# Patient Record
Sex: Female | Born: 1975 | Race: White | Hispanic: No | Smoking: Current every day smoker
Health system: Southern US, Community
[De-identification: ages and names within clinical notes are randomized; demographics above are authoritative.]

## PROBLEM LIST (undated history)

## (undated) DIAGNOSIS — F32A Depression, unspecified: Secondary | ICD-10-CM

## (undated) DIAGNOSIS — F419 Anxiety disorder, unspecified: Secondary | ICD-10-CM

## (undated) HISTORY — DX: Depression, unspecified: F32.A

## (undated) HISTORY — DX: Anxiety disorder, unspecified: F41.9

---

## 2011-04-25 ENCOUNTER — Ambulatory Visit: Payer: Self-pay

## 2012-10-04 LAB — COMPREHENSIVE METABOLIC PANEL
Anion Gap: 7 (ref 7–16)
BUN: 8 mg/dL (ref 7–18)
Bilirubin,Total: 0.9 mg/dL (ref 0.2–1.0)
Chloride: 106 mmol/L (ref 98–107)
Creatinine: 0.7 mg/dL (ref 0.60–1.30)
EGFR (Non-African Amer.): >60
Osmolality: 271 (ref 275–301)
SGOT(AST): 15 U/L (ref 15–37)
Total Protein: 7.7 g/dL (ref 6.4–8.2)

## 2012-10-04 LAB — CBC
HCT: 41.2 % (ref 35.0–47.0)
HCT: 37.3 % (ref 35.0–47.0)
HGB: 12.5 g/dL (ref 12.0–16.0)
MCH: 30 pg (ref 26.0–34.0)
MCH: 29.8 pg (ref 26.0–34.0)
MCHC: 33.5 g/dL (ref 32.0–36.0)
MCHC: 33.4 g/dL (ref 32.0–36.0)
MCV: 90 fL (ref 80–100)
Platelet: 244 10*3/uL (ref 150–440)
RBC: 4.6 10*6/uL (ref 3.80–5.20)
RDW: 13.5 % (ref 11.5–14.5)

## 2012-10-04 LAB — HCG, QUANTITATIVE, PREGNANCY: Beta Hcg, Quant.: 81517 m[IU]/mL — ABNORMAL HIGH

## 2012-10-04 LAB — URINALYSIS, COMPLETE
Specific Gravity: 1.021 (ref 1.003–1.030)
Squamous Epithelial: 4
WBC UR: 1 /HPF (ref 0–5)

## 2012-10-04 LAB — LIPASE, BLOOD: Lipase: 110 U/L (ref 73–393)

## 2012-10-05 LAB — CBC WITH DIFFERENTIAL/PLATELET
Basophil #: 0.2 10*3/uL — ABNORMAL HIGH (ref 0.0–0.1)
Basophil %: 2.3 %
Eosinophil #: 0.2 10*3/uL (ref 0.0–0.7)
Eosinophil %: 1.8 %
HGB: 12.3 g/dL (ref 12.0–16.0)
Lymphocyte %: 13 %
MCH: 29.8 pg (ref 26.0–34.0)
Monocyte #: 0.4 x10 3/mm (ref 0.2–0.9)
Monocyte %: 4 %
Neutrophil #: 7 10*3/uL — ABNORMAL HIGH (ref 1.4–6.5)
Neutrophil %: 78.9 %
Platelet: 208 10*3/uL (ref 150–440)

## 2012-10-05 LAB — CBC
HCT: 36 % (ref 35.0–47.0)
HGB: 12 g/dL (ref 12.0–16.0)
MCH: 29.6 pg (ref 26.0–34.0)
MCHC: 33.2 g/dL (ref 32.0–36.0)
MCV: 89 fL (ref 80–100)
Platelet: 184 10*3/uL (ref 150–440)

## 2012-10-05 LAB — HCG, QUANTITATIVE, PREGNANCY: Beta Hcg, Quant.: 66419 m[IU]/mL — ABNORMAL HIGH

## 2012-10-08 ENCOUNTER — Inpatient Hospital Stay: Payer: Self-pay | Admitting: Obstetrics and Gynecology

## 2012-10-08 LAB — CBC WITH DIFFERENTIAL/PLATELET
Basophil #: 0.1 10*3/uL (ref 0.0–0.1)
Eosinophil %: 1.1 %
HCT: 35.7 % (ref 35.0–47.0)
HGB: 12.3 g/dL (ref 12.0–16.0)
Lymphocyte #: 1.5 10*3/uL (ref 1.0–3.6)
Lymphocyte %: 13.3 %
MCH: 30.8 pg (ref 26.0–34.0)
MCHC: 34.5 g/dL (ref 32.0–36.0)
Monocyte #: 0.6 x10 3/mm (ref 0.2–0.9)
Monocyte %: 4.8 %
RBC: 4 10*6/uL (ref 3.80–5.20)
WBC: 11.5 10*3/uL — ABNORMAL HIGH (ref 3.6–11.0)

## 2012-10-09 LAB — HEMOGLOBIN: HGB: 12.3 g/dL (ref 12.0–16.0)

## 2014-01-31 IMAGING — US US OB < 14 WEEKS - US OB TV
1 series · 14 of 28 positions shown · non-contrast
Comparison: none

REASON FOR EXAM: pelvic pain and vaginal bleeding
COMMENTS:

[Series 1: us ob < 14 weeks - us ob tv · 0.21mm/px · 14 of 62 slices shown]
[im 3/62]
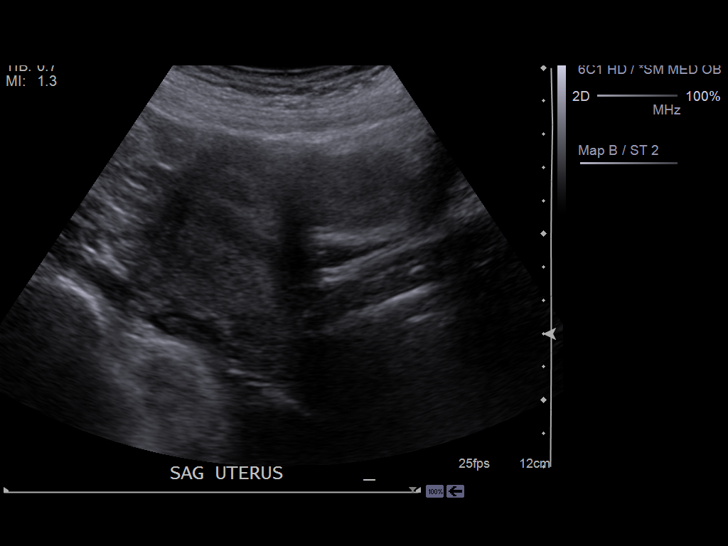
[im 7/62]
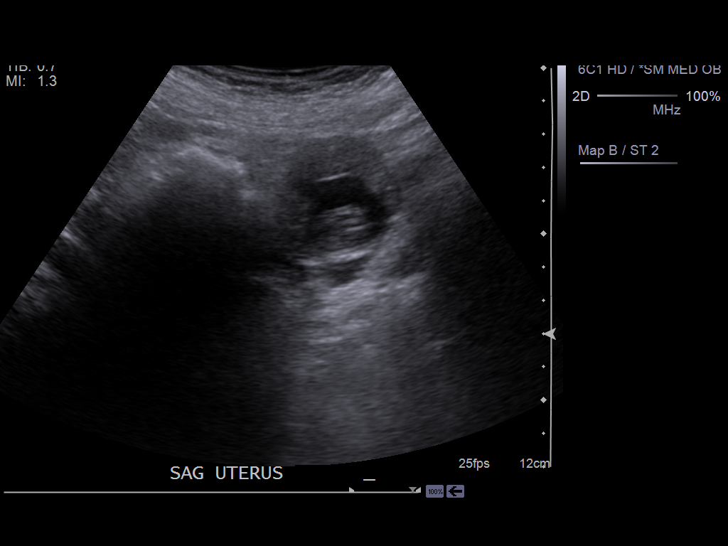
[im 12/62]
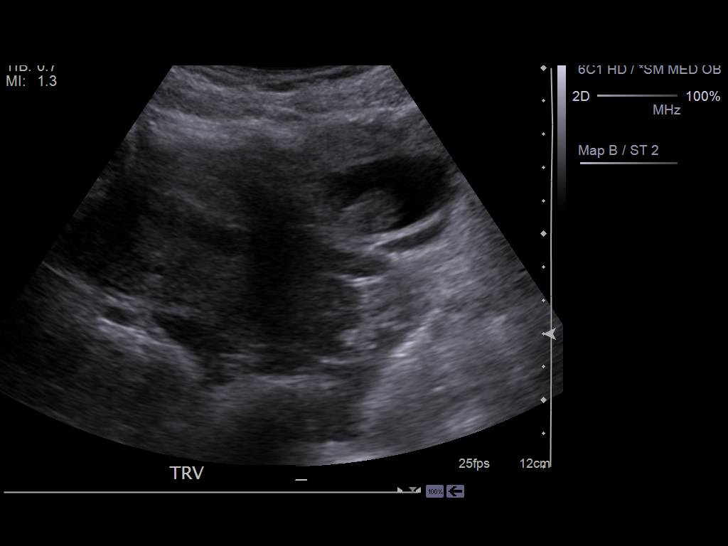
[im 16/62]
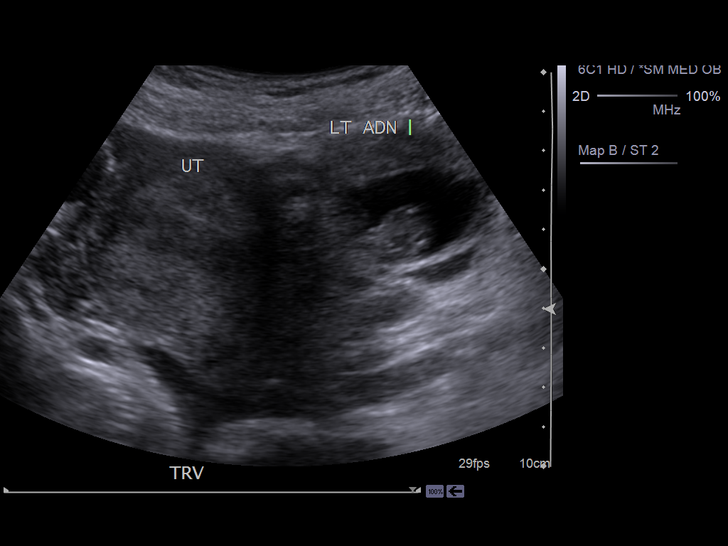
[im 21/62]
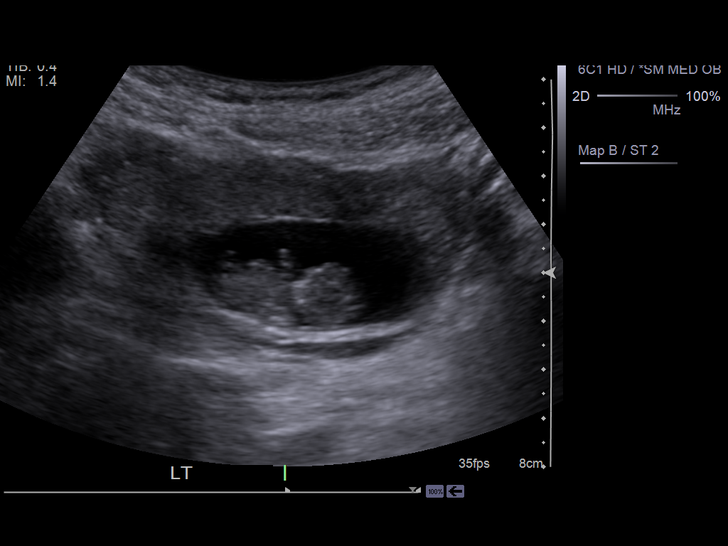
[im 25/62]
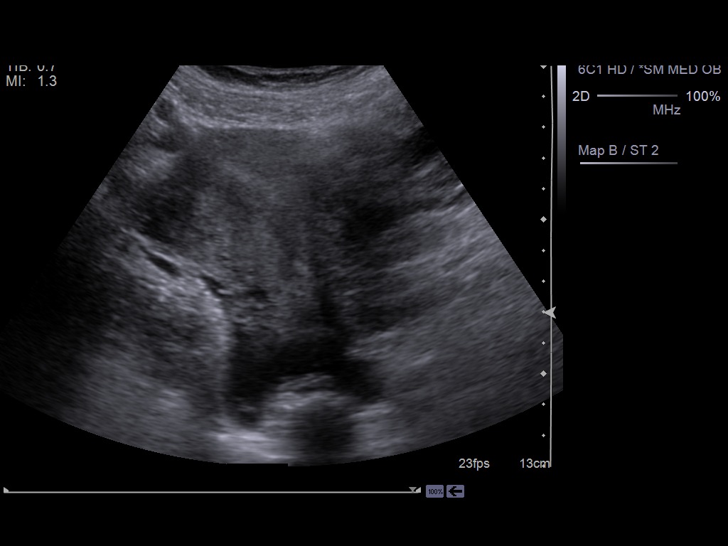
[im 30/62]
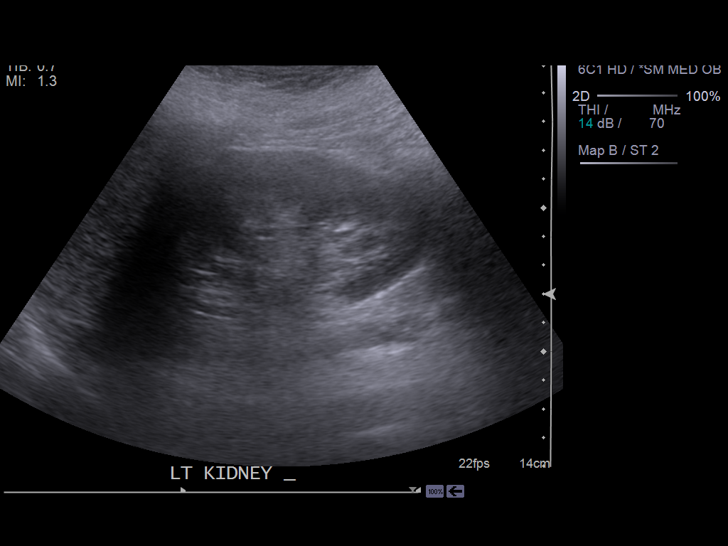
[im 34/62]
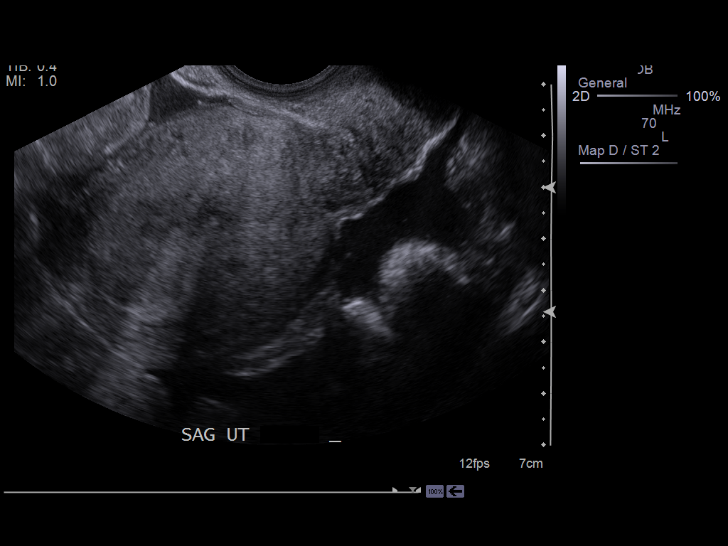
[im 39/62]
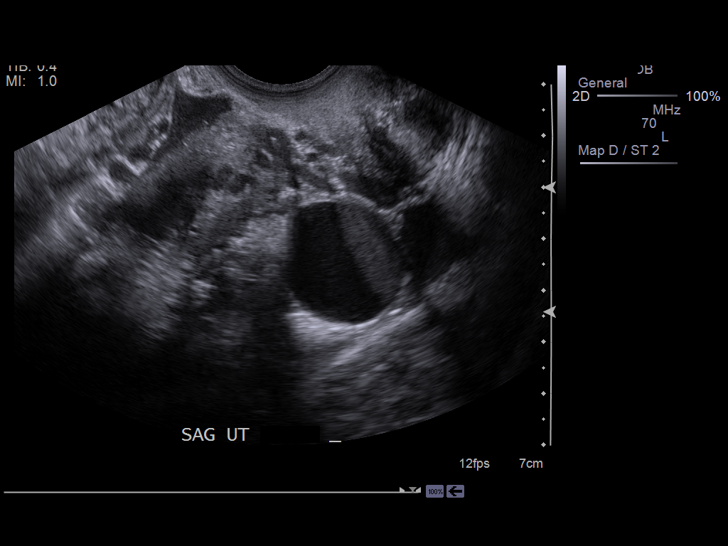
[im 43/62]
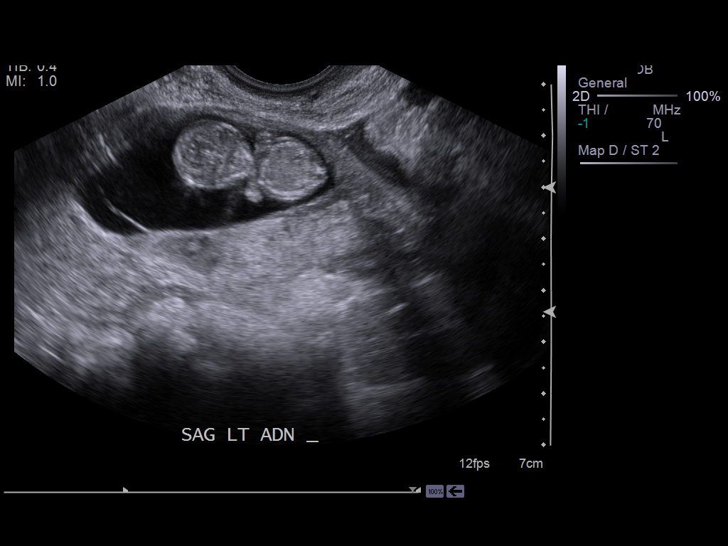
[im 48/62]
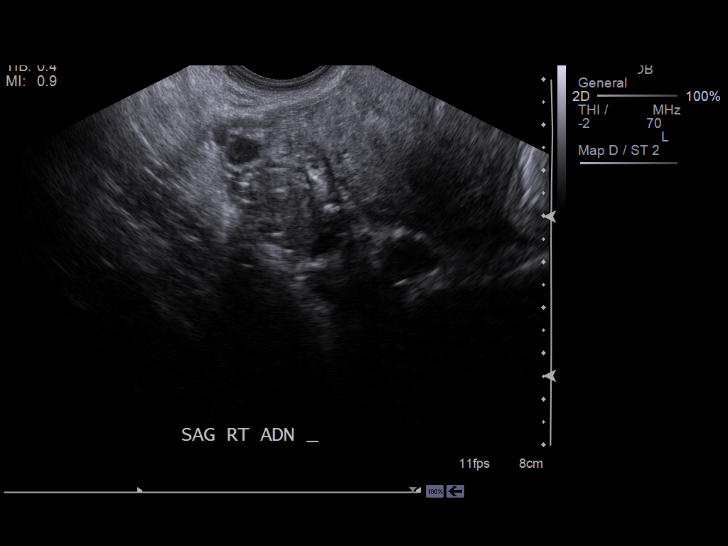
[im 52/62]
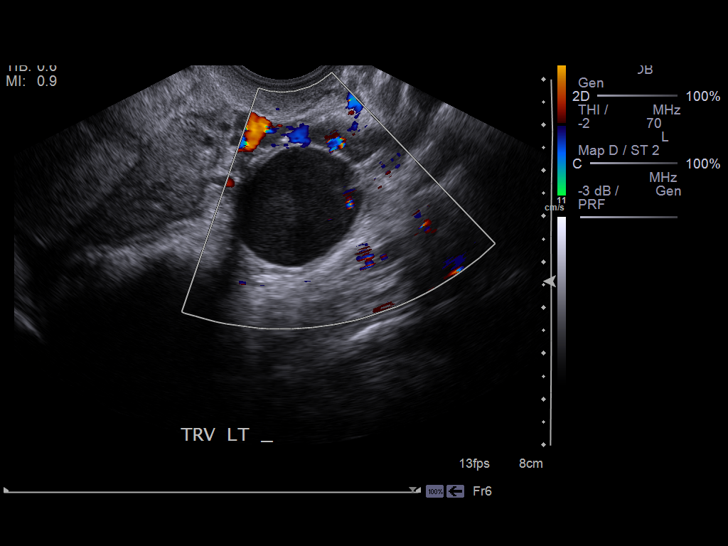
[im 57/62]
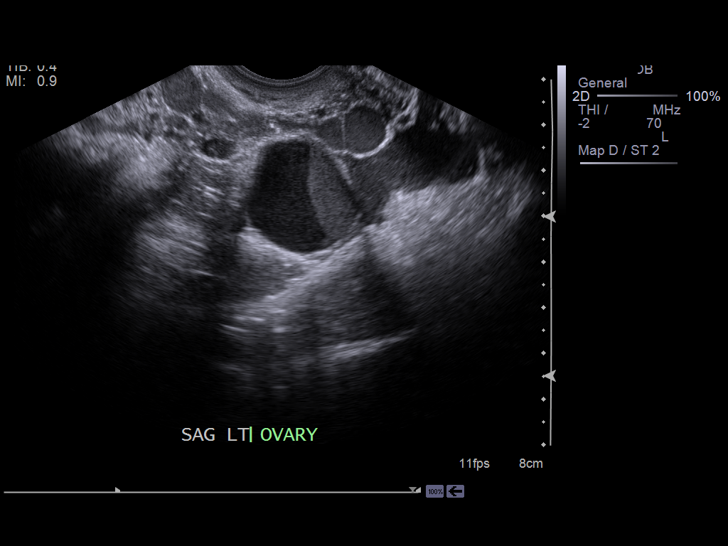
[im 62/62]
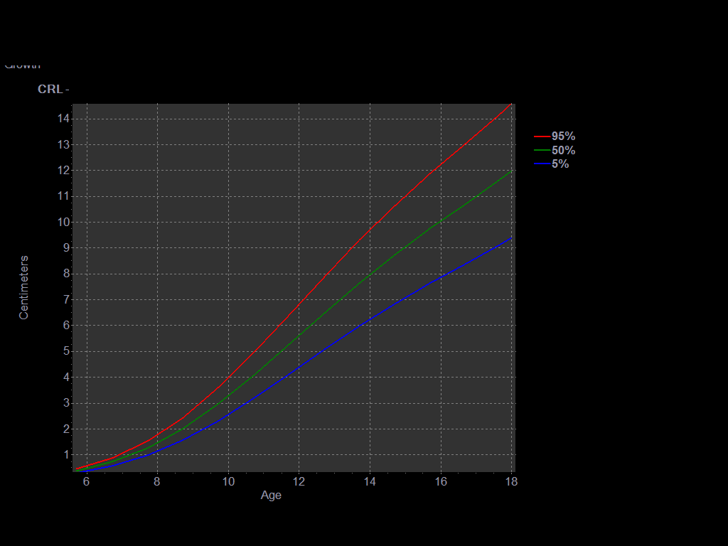

[14 of 28 positions shown; findings below may reference images not displayed]

PROCEDURE:     US  - US OB LESS THAN 14 WEEKS/W TRANS  - October 04, 2012  [DATE]

RESULT:     Standard pelvic ultrasound obtained. No IUP identified. A  9
weeks 6 day pregnancy is noted in the left adnexa consistent with ectopic
pregnancy. Complex hypoechoic region is noted inferior to the ectopic. Free
fluid is noted in the cul-de-sac. Ruptured ectopic should be considered.
IMPRESSION: Findings consistent with large ectopic pregnancy on the
left with possible rupture. This report was phoned to the patient's
physician at time of study.

## 2014-02-04 IMAGING — US US OB < 14 WKS - NRPT
1 series · 14 of 28 positions shown · non-contrast
Comparison: none

[Series 1: us ob < 14 wks - nrpt · 0.25mm/px · 14 of 147 slices shown]
[im 6/147]
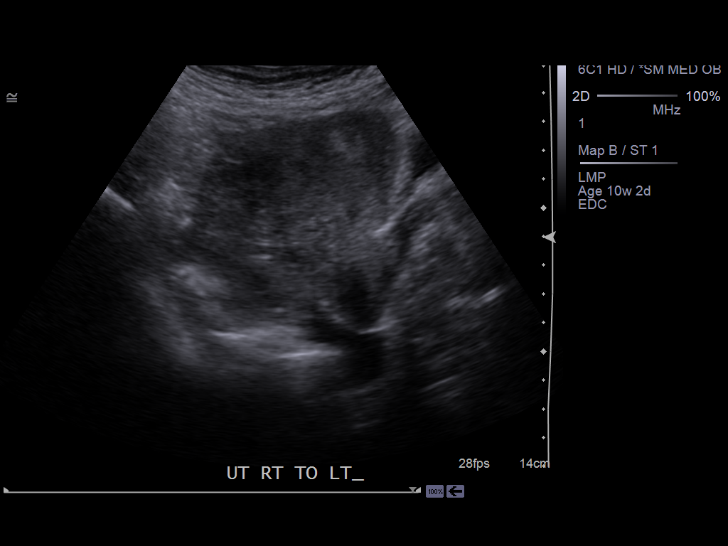
[im 17/147]
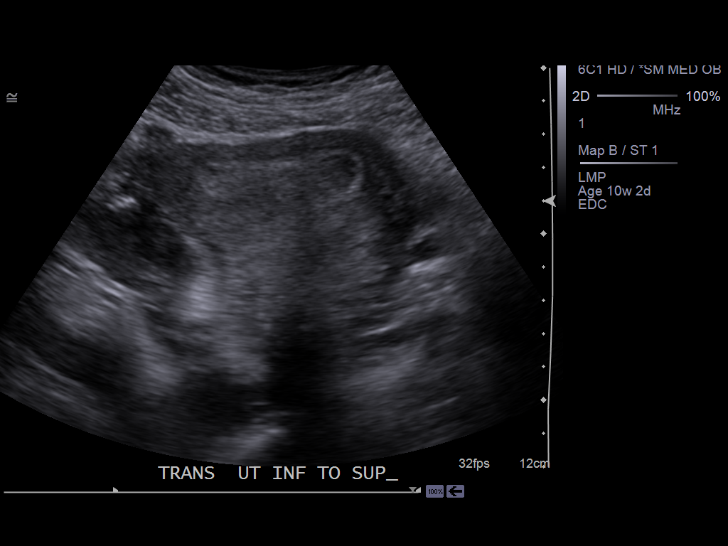
[im 28/147]
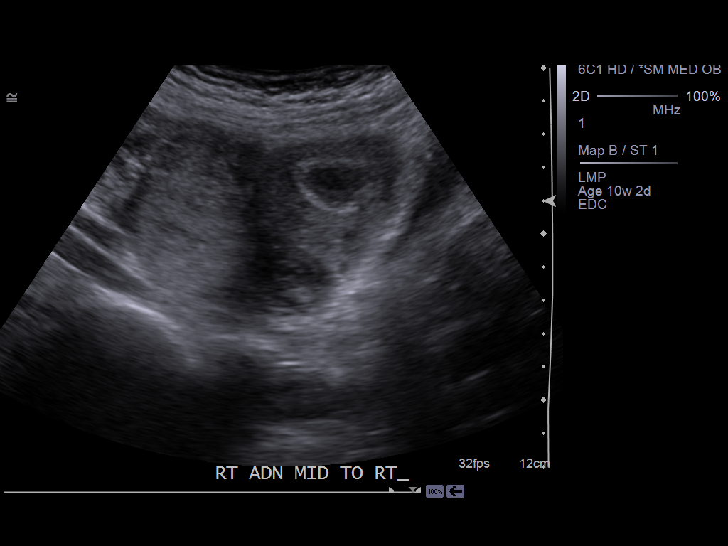
[im 38/147]
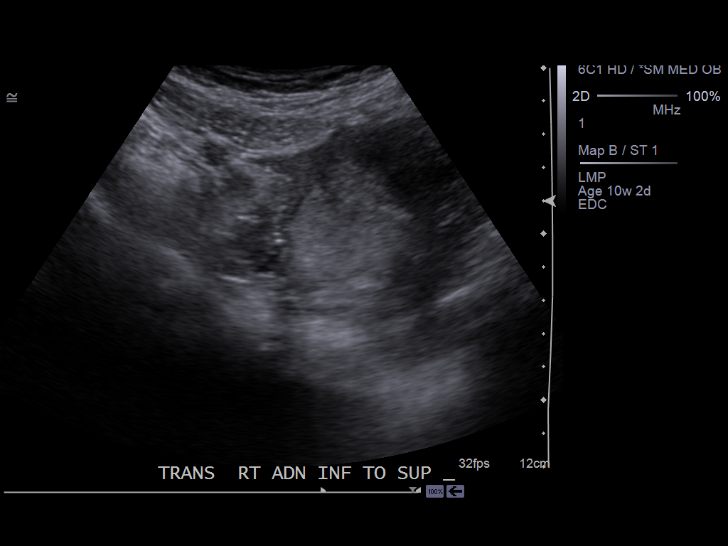
[im 49/147]
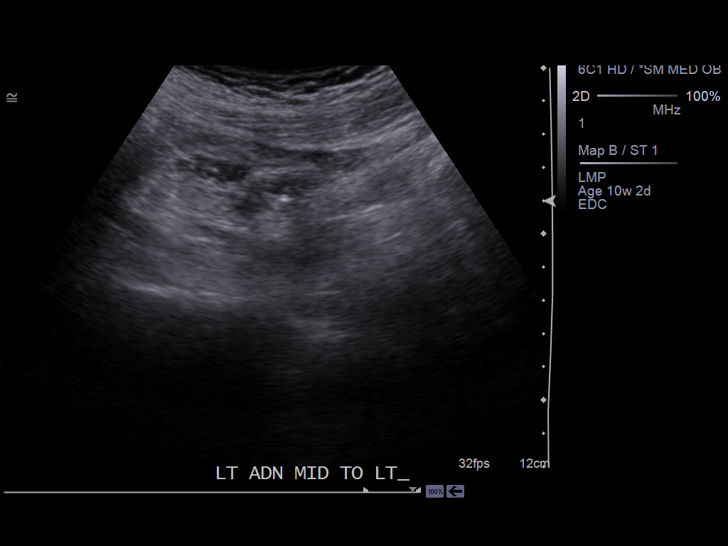
[im 60/147]
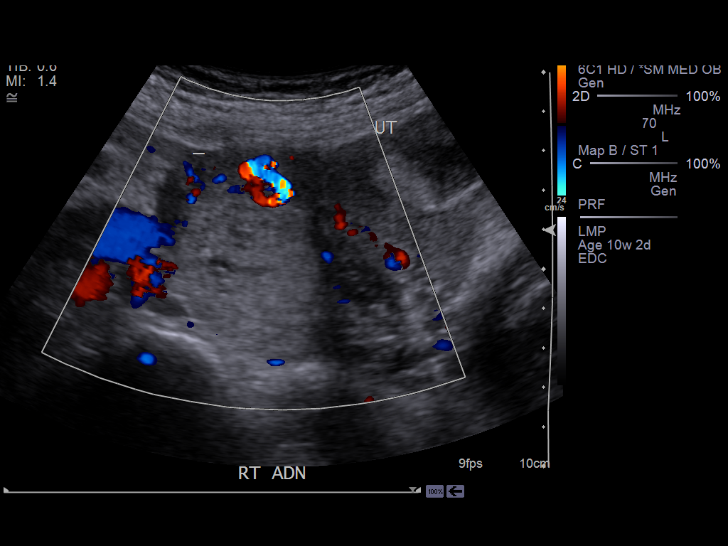
[im 71/147]
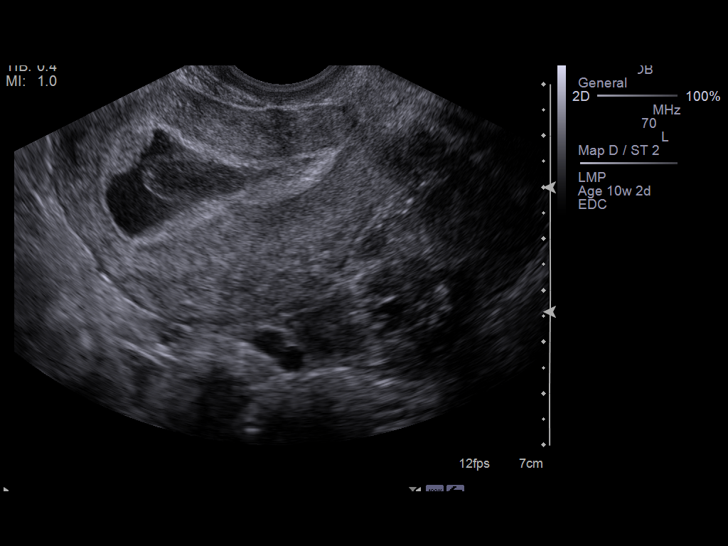
[im 82/147]
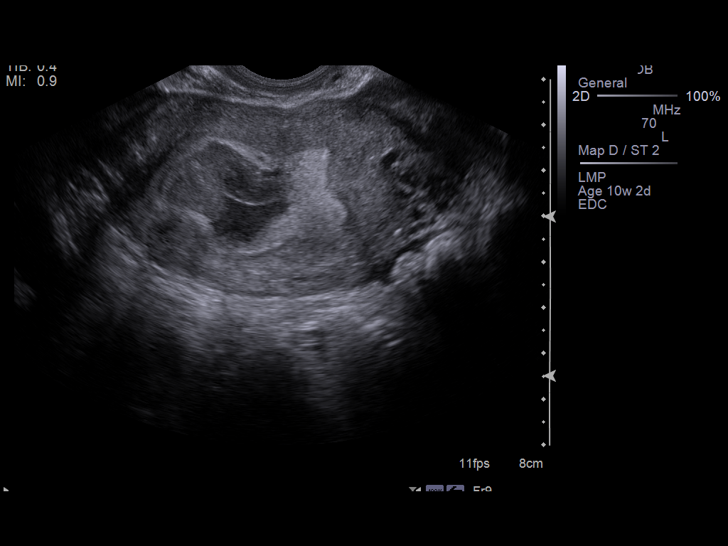
[im 92/147]
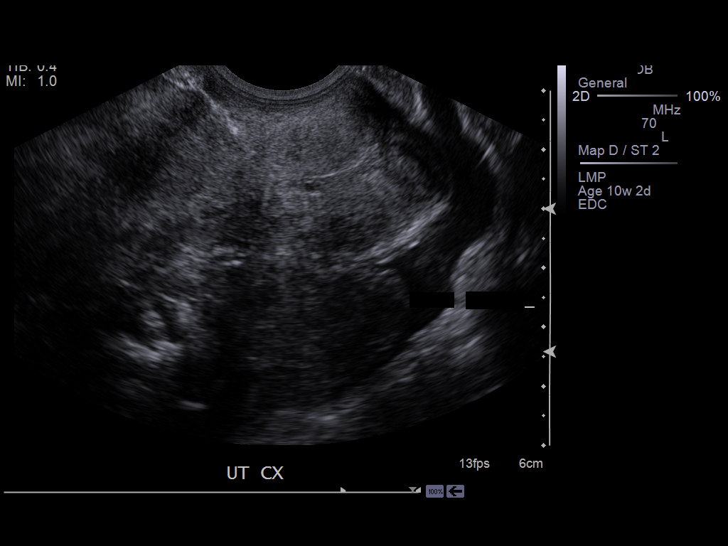
[im 103/147]
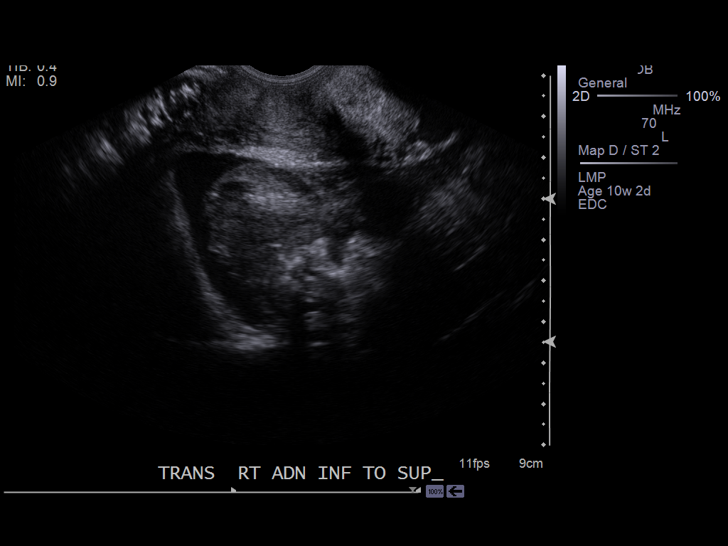
[im 114/147]
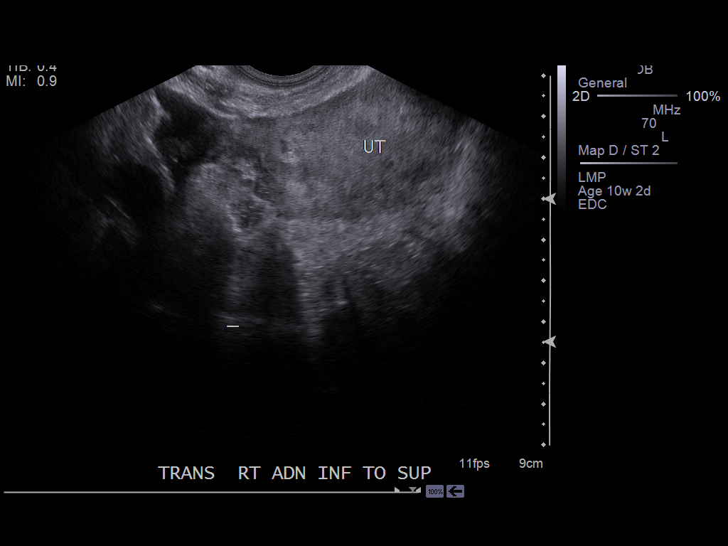
[im 125/147]
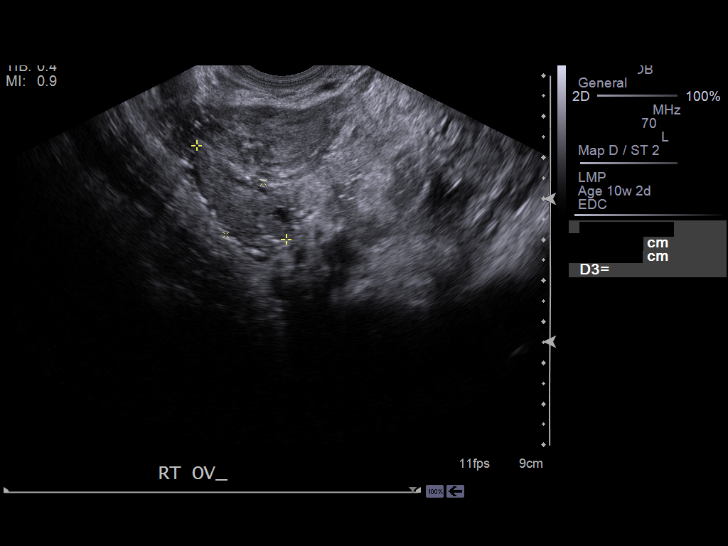
[im 136/147]
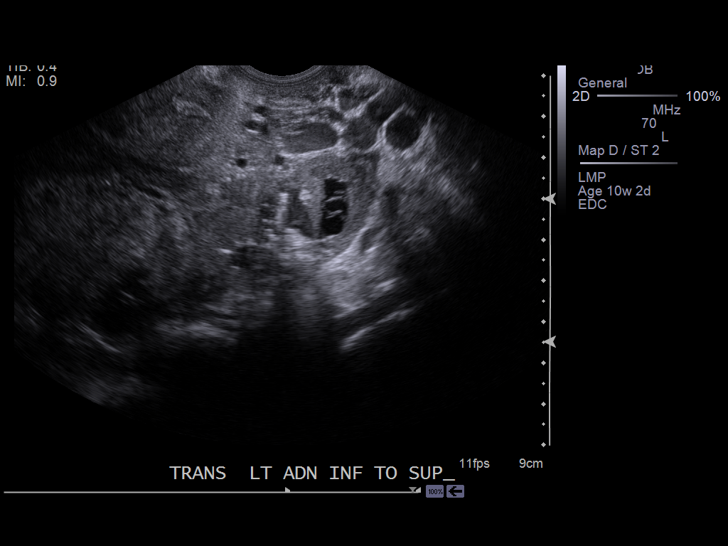
[im 147/147]
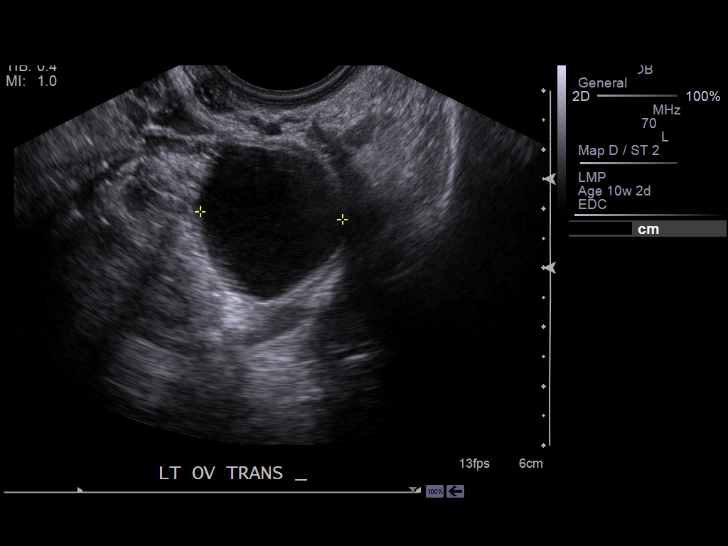

[14 of 28 positions shown; findings below may reference images not displayed]

IMAGES IMPORTED FROM THE [employer] WORKFLOW SYSTEM
NO DICTATION FOR STUDY

## 2014-11-21 NOTE — Op Note (Signed)
PATIENT NAME:  Cathy Ochoa, Cathy Ochoa MR#:  [id number] DATE OF BIRTH:  April 18, 1976  DATE OF PROCEDURE:  10/08/2012  INDICATIONS FOR PROCEDURE: The patient is a 39 year old female who was admitted with pelvic pain. Ultrasound showed a left adnexal mass approximately 6 cm diameter. The patient was taken to the operating room on 10/08/2012.   PROCEDURE: Laparotomy and removal of left tubal pregnancy with salpingectomy.   SURGEON: Cassell ClementLynde Knowles Jonas, MD   ASSISTANT: None.   ANESTHESIA: General.   ESTIMATED BLOOD LOSS: 100 mL.   COMPLICATIONS: None.   SPECIMEN REMOVED: Left tube with products of conception sent to pathology for permanent fixation.   FINDINGS: The left tube was scarred down to the posterior surface of the uterus down to the cul-de-sac and contained an approximately 3 x 6 cm mass consistent with an ectopic pregnancy.   DESCRIPTION OF PROCEDURE: The patient was taken to the OR where she was prepped and draped in the usual sterile fashion in the dorsal supine position. After placement of a Foley catheter, the patient was prepped and draped in the usual fashion for a laparotomy. The scalpel was used to make a Pfannenstiel incision, and this was carried down through the subcutaneous tissues to the fascia. The fascia was then incised bilaterally, and the fascia was then tented up and dissected sharply away from the underlying rectus muscle. The rectus muscle was split in the midline, and the peritoneum was identified, tented up, and entered without complication. The Balfour retractor was placed and the bowel was packed away. The abdomen and pelvis were explored with normal findings other than described above. There was a left tubal mass consistent with an ectopic pregnancy. It was unruptured. The round ligament on the left in the left adnexa was identified and was clamped using a Heaney hysterectomy clamp, and the infundibulopelvic ligament was identified.  A window in the broad ligament was made  using sharp dissection, and the utero-ovarian pedicle was doubly clamped, and the fallopian tube at the level of the ectopic pregnancy was cross-clamped. The ectopic was then sharply dissected off the pedicles and removed from the field. It was sent to pathology for permanent fixation. The utero-ovarian ligament was then doubly suture ligatured using 0 Vicryl, and the portion of the tube that was transected was suture ligatured using the same suture. The pedicles were noted to be hemostatic. The left sidewall was examined, and the ureter was noted to be intact and peristalsing, and the urine output was clear and approximately 125 mL.  The abdomen was then copiously irrigated. The pedicles were rechecked, found to be hemostatic. A small amount of Surgicel was placed over the pedicle site. All instruments and packing were removed from the abdomen, and the final count was noted to be correct. The fascia was then closed using a 0 Maxon, and the skin was closed using staples. The patient was extubated in the Operating Room and returned to the PACU in stable condition.    ____________________________ Beryle QuantLynde G. Toya SmothersKnowles-Jonas, MD [id number] D: 10/08/2012 18:27:01 ET T: 10/09/2012 11:17:42 ET JOB#: [id number]  cc: Beryle QuantLynde G. Toya SmothersKnowles-Jonas, MD, <Dictator> Waverly FerrariLYNDE Toya SmothersKNOWLES JONAS MD ELECTRONICALLY SIGNED 10/09/2012 19:59

## 2021-11-10 ENCOUNTER — Ambulatory Visit: Payer: Self-pay | Admitting: Family Medicine

## 2021-11-12 ENCOUNTER — Ambulatory Visit: Payer: Self-pay | Admitting: Family Medicine

## 2021-12-28 ENCOUNTER — Encounter: Payer: Self-pay | Admitting: Family Medicine

## 2021-12-28 ENCOUNTER — Ambulatory Visit (INDEPENDENT_AMBULATORY_CARE_PROVIDER_SITE_OTHER): Payer: Medicaid Other | Admitting: Family Medicine

## 2021-12-28 VITALS — BP 138/88 | HR 78 | Ht 69.0 in | Wt 218.2 lb

## 2021-12-28 DIAGNOSIS — Z124 Encounter for screening for malignant neoplasm of cervix: Secondary | ICD-10-CM | POA: Diagnosis not present

## 2021-12-28 DIAGNOSIS — F339 Major depressive disorder, recurrent, unspecified: Secondary | ICD-10-CM

## 2021-12-28 DIAGNOSIS — F32A Depression, unspecified: Secondary | ICD-10-CM | POA: Insufficient documentation

## 2021-12-28 MED ORDER — VENLAFAXINE HCL ER 75 MG PO CP24: 75.0000 mg | ORAL_CAPSULE | Freq: Every day | ORAL | 1 refills | Status: DC

## 2021-12-28 MED ORDER — VENLAFAXINE HCL ER 37.5 MG PO CP24: ORAL_CAPSULE | ORAL | 0 refills | Status: DC

## 2021-12-28 NOTE — Assessment & Plan Note (Signed)
Referral to gynecology for establishment of well woman care

## 2021-12-28 NOTE — Assessment & Plan Note (Signed)
Patient with acute on chronic progression depression, noted primarily with past 1 year, at that time she did have the loss of an individual near to her.  She has had similar symptomatology for years and has trialed Zoloft and Prozac for a few weeks and discontinued due to not noting significant response in that time.  She denies any thoughts of self-harm or harm to others, specifically no SI.  She is noting hypersomnia, loss of interest in activities, lack of self care/hygiene, and decreased appetite.  I reviewed various treatment strategies and she is amenable to both referral to psychiatrist for cognitive behavioral therapy, medication management, and initiation of Effexor 37.5 mg x 3 days with titration to 75 mg thereafter until follow-up in 6 weeks.

## 2021-12-28 NOTE — Patient Instructions (Signed)
-   Take 37.5 mg dose of Effexor x 3 days then increase to stronger dose - Continue until follow-up in 6 weeks - Referral coordinator will contact you for scheduling visits with psychiatry and gynecology - Contact for questions

## 2021-12-28 NOTE — Progress Notes (Signed)
     Primary Care / Sports Medicine Office Visit  Patient Information:  Patient ID: Cathy Ochoa, female DOB: March 22, 1976 Age: 46 y.o. MRN: [id number]   Cathy Ochoa is a pleasant 46 y.o. female presenting with the following:  Chief Complaint  Patient presents with   Establish Care   Depression    Tried Zoloft and Prozac didn't feel like they didn't work, only tried for 4 weeks. Is willing to try anything right now.     Vitals:   12/28/21 1055  BP: 138/88  Pulse: 78  SpO2: 96%   Vitals:   12/28/21 1055  Weight: 218 lb 3.2 oz (99 kg)  Height: 5\' 9"  (1.753 m)   Body mass index is 32.22 kg/m.  No results found.   Independent interpretation of notes and tests performed by another provider:   None  Procedures performed:   None  Pertinent History, Exam, Impression, and Recommendations:   Problem List Items Addressed This Visit       Other   Depression - Primary    Patient with acute on chronic progression depression, noted primarily with past 1 year, at that time she did have the loss of an individual near to her.  She has had similar symptomatology for years and has trialed Zoloft and Prozac for a few weeks and discontinued due to not noting significant response in that time.  She denies any thoughts of self-harm or harm to others, specifically no SI.  She is noting hypersomnia, loss of interest in activities, lack of self care/hygiene, and decreased appetite.  I reviewed various treatment strategies and she is amenable to both referral to psychiatrist for cognitive behavioral therapy, medication management, and initiation of Effexor 37.5 mg x 3 days with titration to 75 mg thereafter until follow-up in 6 weeks.       Relevant Medications   venlafaxine XR (EFFEXOR XR) 37.5 MG 24 hr capsule   venlafaxine XR (EFFEXOR XR) 75 MG 24 hr capsule   Other Relevant Orders   Ambulatory referral to Psychiatry   Cervical cancer screening    Referral to gynecology for  establishment of well woman care       Relevant Orders   Ambulatory referral to Gynecology     Orders & Medications Meds ordered this encounter  Medications   venlafaxine XR (EFFEXOR XR) 37.5 MG 24 hr capsule    Sig: One tab by mouth daily for 3 days then switch to the higher dose    Dispense:  3 capsule    Refill:  0   venlafaxine XR (EFFEXOR XR) 75 MG 24 hr capsule    Sig: Take 1 capsule (75 mg total) by mouth daily with breakfast.    Dispense:  30 capsule    Refill:  1   Orders Placed This Encounter  Procedures   Ambulatory referral to Psychiatry   Ambulatory referral to Gynecology     Return in about 6 weeks (around 02/08/2022).     04/11/2022, MD   Primary Care Sports Medicine [facility]

## 2022-01-18 ENCOUNTER — Encounter: Payer: Self-pay | Admitting: Obstetrics

## 2022-01-18 ENCOUNTER — Other Ambulatory Visit (HOSPITAL_COMMUNITY)
Admission: RE | Admit: 2022-01-18 | Discharge: 2022-01-18 | Disposition: A | Discharge status: Home / Self Care | Payer: Medicaid Other | Source: Ambulatory Visit | Attending: Obstetrics | Admitting: Obstetrics

## 2022-01-18 ENCOUNTER — Ambulatory Visit (INDEPENDENT_AMBULATORY_CARE_PROVIDER_SITE_OTHER): Payer: Medicaid Other | Admitting: Obstetrics

## 2022-01-18 VITALS — BP 134/76 | Ht 69.0 in | Wt 213.8 lb

## 2022-01-18 DIAGNOSIS — Z1231 Encounter for screening mammogram for malignant neoplasm of breast: Secondary | ICD-10-CM

## 2022-01-18 DIAGNOSIS — Z124 Encounter for screening for malignant neoplasm of cervix: Secondary | ICD-10-CM | POA: Diagnosis not present

## 2022-01-18 DIAGNOSIS — Z8049 Family history of malignant neoplasm of other genital organs: Secondary | ICD-10-CM | POA: Diagnosis not present

## 2022-01-18 DIAGNOSIS — Z1151 Encounter for screening for human papillomavirus (HPV): Secondary | ICD-10-CM | POA: Insufficient documentation

## 2022-01-18 DIAGNOSIS — Z0001 Encounter for general adult medical examination with abnormal findings: Secondary | ICD-10-CM | POA: Diagnosis not present

## 2022-01-18 DIAGNOSIS — L680 Hirsutism: Secondary | ICD-10-CM

## 2022-01-18 DIAGNOSIS — Z01411 Encounter for gynecological examination (general) (routine) with abnormal findings: Secondary | ICD-10-CM | POA: Diagnosis not present

## 2022-01-18 DIAGNOSIS — N912 Amenorrhea, unspecified: Secondary | ICD-10-CM

## 2022-01-18 DIAGNOSIS — F17200 Nicotine dependence, unspecified, uncomplicated: Secondary | ICD-10-CM

## 2022-01-18 DIAGNOSIS — N949 Unspecified condition associated with female genital organs and menstrual cycle: Secondary | ICD-10-CM

## 2022-01-18 NOTE — Patient Instructions (Signed)
Have a great year! Please call with any concerns. Don't forget to wear your seatbelt everyday! If you are not signed up on MyChart, please ask [place] how to sign up for it!   In a world where you can be anything, please be kind.   Body mass index is 31.57 kg/m.  A Healthy Lifestyle: Care Instructions Your Care Instructions  A healthy lifestyle can help you feel good, stay at a healthy weight, and have plenty of energy for both work and play. A healthy lifestyle is something you can share with your whole family. A healthy lifestyle also can lower your risk for serious health problems, such as high blood pressure, heart disease, and diabetes. You can follow a few steps listed below to improve your health and the health of your family. Follow-up care is a key part of your treatment and safety. Be sure to make and go to all appointments, and call your doctor if you are having problems. It's also a good idea to know your test results and keep a list of the medicines you take. How can you care for yourself at home? Do not eat too much sugar, fat, or fast foods. You can still have dessert and treats now and then. The goal is moderation. Start small to improve your eating habits. Pay attention to portion sizes, drink less juice and soda pop, and eat more fruits and vegetables. Eat a healthy amount of food. A 3-ounce serving of meat, for example, is about the size of a deck of cards. Fill the rest of your plate with vegetables and whole grains. Limit the amount of soda and sports drinks you have every day. Drink more water when you are thirsty. Eat at least 5 servings of fruits and vegetables every day. It may seem like a lot, but it is not hard to reach this goal. A serving or helping is 1 piece of fruit, 1 cup of vegetables, or 2 cups of leafy, raw vegetables. Have an apple or some carrot sticks as an afternoon snack instead of a candy bar. Try to have fruits and/or vegetables at every meal. Make exercise  part of your daily routine. You may want to start with simple activities, such as walking, bicycling, or slow swimming. Try to be active 30 to 60 minutes every day. You do not need to do all 30 to 60 minutes all at once. For example, you can exercise 3 times a day for 10 or 20 minutes. Moderate exercise is safe for most people, but it is always a good idea to talk to your doctor before starting an exercise program. Keep moving. Mow the lawn, work in the garden, or [employer]. Take the stairs instead of the elevator at work. If you smoke, quit. People who smoke have an increased risk for heart attack, stroke, cancer, and other lung illnesses. Quitting is hard, but there are ways to boost your chance of quitting tobacco for good. Use nicotine gum, patches, or lozenges. Ask your doctor about stop-smoking programs and medicines. Keep trying. In addition to reducing your risk of diseases in the future, you will notice some benefits soon after you stop using tobacco. If you have shortness of breath or asthma symptoms, they will likely get better within a few weeks after you quit. Limit how much alcohol you drink. Moderate amounts of alcohol (up to 2 drinks a day for men, 1 drink a day for women) are okay. But drinking too much can lead to  liver problems, high blood pressure, and other health problems. Family health If you have a family, there are many things you can do together to improve your health. Eat meals together as a family as often as possible. Eat healthy foods. This includes fruits, vegetables, lean meats and dairy, and whole grains. Include your family in your fitness plan. Most people think of activities such as jogging or tennis as the way to fitness, but there are many ways you and your family can be more active. Anything that makes you breathe hard and gets your heart pumping is exercise. Here are some tips: Walk to do errands or to take your child to school or the bus. Go for a family  bike ride after dinner instead of watching TV. Care instructions adapted under license by your healthcare professional. This care instruction is for use with your licensed healthcare professional. If you have questions about a medical condition or this instruction, always ask your healthcare professional. [place] any warranty or liability for your use of this information.

## 2022-01-18 NOTE — Progress Notes (Signed)
Chief Complaint  Patient presents with   Gynecologic Exam   Patient Cathy Ochoa is an 46 y.o. year old [id number]  No LMP recorded (within months). currently nothing for contraception who presents for annual. Patient is exercising regularly. Patient does perform self breast exam. Patient reports family history of genetic cancer; mother died at 54 from uterine cancer.  Patient takes no multivitamin. Patient is not interested in a birth control method. Patient is not sexually active with no problems.  Patient reports occ hot flash vasomotor symptoms - not super bothersome, or vaginal dryness. Patients pronouns are she her  On new meds for depression Works at a nursing home. Had not been to the doctor since her kids were born about 73 yrs ago.  Reports vagina feels swollen over a week; Patient denies vaginal discharge, odor, burning or itching. She complains of hirsutism since teenager, denies acne,scalp hair loss. She denies voice or clitoral changes.  Primary care provider: Montel Culver, MD  Review of Systems  Constitutional:  Positive for diaphoresis. Negative for activity change, appetite change, chills, fatigue, fever and unexpected weight change.  HENT:  Negative for congestion, dental problem, drooling, ear discharge, ear pain, facial swelling, hearing loss, mouth sores, nosebleeds, postnasal drip, rhinorrhea, sinus pressure, sinus pain, sneezing, sore throat, tinnitus, trouble swallowing and voice change.   Eyes:  Negative for photophobia, pain, discharge, redness, itching and visual disturbance.  Respiratory:  Negative for apnea, cough, choking, chest tightness, shortness of breath, wheezing and stridor.   Cardiovascular:  Negative for chest pain, palpitations and leg swelling.  Gastrointestinal:  Negative for abdominal distention, abdominal pain, anal bleeding, blood in stool, constipation, diarrhea, nausea, rectal pain and vomiting.  Endocrine: Negative for cold intolerance, heat  intolerance, polydipsia, polyphagia and polyuria.  Genitourinary:  Negative for decreased urine volume, difficulty urinating, dyspareunia, dysuria, enuresis, flank pain, frequency, genital sores, hematuria, menstrual problem, pelvic pain, urgency, vaginal bleeding, vaginal discharge and vaginal pain.  Musculoskeletal:  Negative for arthralgias, back pain, gait problem, joint swelling, myalgias, neck pain and neck stiffness.  Skin:  Negative for color change, pallor, rash and wound.  Allergic/Immunologic: Negative for environmental allergies, food allergies and immunocompromised state.  Neurological:  Negative for dizziness, tremors, seizures, syncope, facial asymmetry, speech difficulty, weakness, light-headedness, numbness and headaches.  Hematological:  Negative for adenopathy. Does not bruise/bleed easily.  Psychiatric/Behavioral:  Positive for dysphoric mood. Negative for agitation, behavioral problems, confusion, decreased concentration, hallucinations, self-injury, sleep disturbance and suicidal ideas. The patient is not nervous/anxious and is not hyperactive.     Menstrual history: Menarche: 14 Period Cycle (Days): 28 Period Duration (Days): 5-7 Period Pattern: (!) Irregular Menstrual Flow: Heavy Dysmenorrhea: (!) Mild Dysmenorrhea Symptoms: Diarrhea Reports irregular menses x 2 years. Last period about 1 month ago but before that only one in 12 mo. Periods were clockwork before  Then just stopped.   Past Medical History:  Diagnosis Date   Depression    Past Surgical History:  Procedure Laterality Date   CESAREAN SECTION  2010   No family history on file. Social History   Socioeconomic History   Marital status: Widowed    Spouse name: Not on file   Number of children: Not on file   Years of education: Not on file   Highest education level: Not on file  Occupational History   Not on file  Tobacco Use   Smoking status: Every Day    Types: Cigarettes   Smokeless tobacco:  Never  Vaping Use  Vaping Use: Never used  Substance and Sexual Activity   Alcohol use: Not Currently   Drug use: Not Currently   Sexual activity: Not Currently  Other Topics Concern   Not on file  Social History Narrative   Not on file   Social Determinants of Health   Financial Resource Strain: Not on file  Food Insecurity: Not on file  Transportation Needs: Not on file  Physical Activity: Not on file  Stress: Not on file  Social Connections: Not on file  Intimate Partner Violence: Not on file   Remote history of abnormal paps. Had a LEEP. On pap in atleast 13 yrs.  Patient denies pelvic infections. Patient denies domestic violence or sexual abuse Patient has not received Gardisil series  Health Maintenance  Topic Date Due   COVID-19 Vaccine (1) Never done   HIV Screening  Never done   Hepatitis C Screening  Never done   PAP SMEAR-Modifier  Never done   TETANUS/TDAP  12/29/2022 (Originally 07/17/2021)   INFLUENZA VACCINE  03/01/2022   HPV VACCINES  Aged Out   Medicine list and allergies reviewed and updated.     Objective:  BP 134/76   Ht 5' 9"  (1.753 m)   Wt 213 lb 12.8 oz (97 kg)   LMP  (Within Months) Comment: within 30 days.  BMI 31.57 kg/m     12/28/2021   10:59 AM  Depression screen PHQ 2/9  Decreased Interest 3  Down, Depressed, Hopeless 3  PHQ - 2 Score 6  Altered sleeping 2  Tired, decreased energy 2  Change in appetite 2  Feeling bad or failure about yourself  3  Trouble concentrating 3  Moving slowly or fidgety/restless 2  Suicidal thoughts 0  PHQ-9 Score 20  Difficult doing work/chores Somewhat difficult      12/28/2021   11:00 AM  GAD 7 : Generalized Anxiety Score  Nervous, Anxious, on Edge 0  Control/stop worrying 0  Worry too much - different things 2  Trouble relaxing 1  Restless 0  Easily annoyed or irritable 1  Afraid - awful might happen 1  Total GAD 7 Score 5  Anxiety Difficulty Somewhat difficult     Physical  Exam Vitals and nursing note reviewed. Exam conducted with a chaperone present.  Constitutional:      General: She is not in acute distress.    Appearance: Normal appearance. She is not ill-appearing.  HENT:     Head: Normocephalic and atraumatic.     Mouth/Throat:     Mouth: Mucous membranes are moist.     Pharynx: No oropharyngeal exudate.  Eyes:     Extraocular Movements: Extraocular movements intact.  Neck:     Thyroid: No thyromegaly.  Cardiovascular:     Rate and Rhythm: Normal rate and regular rhythm.     Pulses: Normal pulses.     Heart sounds: Normal heart sounds.  Pulmonary:     Effort: Pulmonary effort is normal. No respiratory distress.     Breath sounds: Normal breath sounds.  Chest:     Chest wall: No mass or tenderness.  Breasts:    Breasts are symmetrical.     Right: Normal. No mass, nipple discharge, skin change or tenderness.     Left: Normal. No mass, nipple discharge, skin change or tenderness.  Abdominal:     General: A surgical scar is present. There is no distension.     Palpations: There is no mass.     Tenderness: There  is no abdominal tenderness. There is no rebound.     Hernia: No hernia is present.  Genitourinary:    General: Normal vulva.     Exam position: Lithotomy position.     Pubic Area: No rash.      Labia:        Right: No rash, tenderness or lesion.        Left: No rash, tenderness or lesion.      Urethra: No urethral lesion.     Vagina: No foreign body. Vaginal discharge present. No tenderness or lesions.     Cervix: No discharge, friability, lesion, erythema or cervical bleeding.     Uterus: Normal. Not enlarged, not tender and no uterine prolapse.      Adnexa: Right adnexa normal and left adnexa normal.       Right: No tenderness or fullness.         Left: No tenderness or fullness.    Musculoskeletal:        General: No tenderness. Normal range of motion.     Cervical back: Normal range of motion. No tenderness.     Right lower  leg: No edema.     Left lower leg: No edema.  Lymphadenopathy:     Cervical: No cervical adenopathy.     Upper Body:     Right upper body: No axillary adenopathy.     Left upper body: No axillary adenopathy.     Lower Body: No right inguinal adenopathy. No left inguinal adenopathy.  Skin:    General: Skin is warm and dry.  Neurological:     General: No focal deficit present.     Mental Status: She is alert and oriented to person, place, and time. Mental status is at baseline.     Coordination: Coordination normal.     Gait: Gait normal.     Deep Tendon Reflexes: Reflexes normal.  Psychiatric:        Mood and Affect: Mood normal.        Behavior: Behavior normal.        Thought Content: Thought content normal.        Judgment: Judgment normal.     Assessment/Plan:   Family history of uterine cancer  Encounter for gynecological examination with abnormal finding - Plan: Anti mullerian hormone, FSH/LH, Estradiol, Testosterone, CBC with Differential, Comp Met (CMET), HgB A1c, VITAMIN D 25 Hydroxy (Vit-D Deficiency, Fractures), Lipid panel, TSH Rfx on Abnormal to Free T4, Prolactin, US PELVIC COMPLETE WITH TRANSVAGINAL  Encounter for screening mammogram for malignant neoplasm of breast - Plan: MM 3D SCREEN BREAST BILATERAL  Screening for malignant neoplasm of cervix - Plan: Cytology - PAP  Screening for human papillomavirus (HPV) - Plan: Cytology - PAP  Vaginal discomfort - Plan: NuSwab Vaginitis (VG)  Hirsutism  Current smoker  Amenorrhea - Plan: Anti mullerian hormone, FSH/LH, Estradiol, Testosterone, CBC with Differential, Comp Met (CMET), HgB A1c, VITAMIN D 25 Hydroxy (Vit-D Deficiency, Fractures), Lipid panel, TSH Rfx on Abnormal to Free T4, Prolactin, US PELVIC COMPLETE WITH TRANSVAGINAL  Follow up Pap HPV Cultures obtained for symptoms Monthly self breast exam. Mammogram ordered Daily multivitamin recommended. Colonoscopy age 53 Contraception: declines Gardisil  discussed declined Wellness labs ordered Ordered labs for AUB and hirsutism Return for pelvic US for recent bleeding Smoking cessation encouraged Recently started on effexor with follow up scheduled  The ASCVD Risk score (Arnett DK, et al., 2019) failed to calculate for the following reasons:   Cannot find a previous  HDL lab   Cannot find a previous total cholesterol lab Exercise discussed with patient.   Return in about 1 year (around 01/19/2023), or if symptoms worsen or fail to improve, for annual/well woman.

## 2022-01-19 LAB — CYTOLOGY - PAP
Comment: NEGATIVE
Diagnosis: NEGATIVE
High risk HPV: NEGATIVE

## 2022-01-19 LAB — TSH RFX ON ABNORMAL TO FREE T4: TSH: 0.973 u[IU]/mL (ref 0.450–4.500)

## 2022-01-20 ENCOUNTER — Telehealth: Payer: Self-pay

## 2022-01-20 ENCOUNTER — Encounter: Payer: Self-pay | Admitting: Obstetrics and Gynecology

## 2022-01-20 NOTE — Progress Notes (Signed)
Called pt to get more info on fam hx, no answer, mail box not set up. Note in pt's chart if pt calls back.

## 2022-01-20 NOTE — Telephone Encounter (Signed)
Called pt to update family history. We need to know when mom was diagnosed with cancer, no answer, mailbox not set up. If 49 or under, pt qualifies for genetic testing. If 50 or older, then pt doesn't qualify.

## 2022-01-21 MED ORDER — METRONIDAZOLE 500 MG PO TABS: 500.0000 mg | ORAL_TABLET | Freq: Two times a day (BID) | ORAL | 0 refills | Status: DC

## 2022-01-22 LAB — LIPID PANEL
Chol/HDL Ratio: 2.6 ratio (ref 0.0–4.4)
Cholesterol, Total: 145 mg/dL (ref 100–199)
HDL: 55 mg/dL (ref >39)
LDL Chol Calc (NIH): 80 mg/dL (ref 0–99)
Triglycerides: 46 mg/dL (ref 0–149)
VLDL Cholesterol Cal: 10 mg/dL (ref 5–40)

## 2022-01-22 LAB — CBC WITH DIFFERENTIAL/PLATELET
Basophils Absolute: 0.1 10*3/uL (ref 0.0–0.2)
Basos: 1 %
EOS (ABSOLUTE): 0.1 10*3/uL (ref 0.0–0.4)
Eos: 1 %
Hematocrit: 41.8 % (ref 34.0–46.6)
Hemoglobin: 14.2 g/dL (ref 11.1–15.9)
Immature Grans (Abs): 0 10*3/uL (ref 0.0–0.1)
Immature Granulocytes: 0 %
Lymphocytes Absolute: 1.4 10*3/uL (ref 0.7–3.1)
Lymphs: 21 %
MCH: 29.7 pg (ref 26.6–33.0)
MCHC: 34 g/dL (ref 31.5–35.7)
MCV: 87 fL (ref 79–97)
Monocytes Absolute: 0.4 10*3/uL (ref 0.1–0.9)
Monocytes: 5 %
Neutrophils Absolute: 4.8 10*3/uL (ref 1.4–7.0)
Neutrophils: 72 %
Platelets: 242 10*3/uL (ref 150–450)
RBC: 4.78 x10E6/uL (ref 3.77–5.28)
RDW: 12 % (ref 11.7–15.4)
WBC: 6.6 10*3/uL (ref 3.4–10.8)

## 2022-01-22 LAB — HEMOGLOBIN A1C
Est. average glucose Bld gHb Est-mCnc: 123 mg/dL
Hgb A1c MFr Bld: 5.9 % — ABNORMAL HIGH (ref 4.8–5.6)

## 2022-01-22 LAB — COMPREHENSIVE METABOLIC PANEL
ALT: 16 IU/L (ref 0–32)
AST: 25 IU/L (ref 0–40)
Albumin/Globulin Ratio: 2 (ref 1.2–2.2)
Albumin: 4.5 g/dL (ref 3.8–4.8)
Alkaline Phosphatase: 97 IU/L (ref 44–121)
BUN/Creatinine Ratio: 14 (ref 9–23)
BUN: 10 mg/dL (ref 6–24)
Bilirubin Total: 0.3 mg/dL (ref 0.0–1.2)
CO2: 24 mmol/L (ref 20–29)
Calcium: 9.2 mg/dL (ref 8.7–10.2)
Chloride: 98 mmol/L (ref 96–106)
Creatinine, Ser: 0.69 mg/dL (ref 0.57–1.00)
Globulin, Total: 2.3 g/dL (ref 1.5–4.5)
Glucose: 81 mg/dL (ref 70–99)
Potassium: 3.8 mmol/L (ref 3.5–5.2)
Sodium: 138 mmol/L (ref 134–144)
Total Protein: 6.8 g/dL (ref 6.0–8.5)
eGFR: 110 mL/min/{1.73_m2} (ref >59)

## 2022-01-22 LAB — ESTRADIOL: Estradiol: 76.5 pg/mL

## 2022-01-22 LAB — NUSWAB VAGINITIS (VG)
Atopobium vaginae: HIGH Score — AB
BVAB 2: HIGH Score — AB
Candida albicans, NAA: NEGATIVE

## 2022-01-22 LAB — PROLACTIN: Prolactin: 20.6 ng/mL (ref 4.8–23.3)

## 2022-01-22 LAB — TESTOSTERONE: Testosterone: 13 ng/dL (ref 4–50)

## 2022-01-22 LAB — VITAMIN D 25 HYDROXY (VIT D DEFICIENCY, FRACTURES): Vit D, 25-Hydroxy: 34.9 ng/mL (ref 30.0–100.0)

## 2022-01-26 ENCOUNTER — Ambulatory Visit
Admission: RE | Admit: 2022-01-26 | Discharge: 2022-01-26 | Disposition: A | Discharge status: Home / Self Care | Payer: Medicaid Other | Source: Ambulatory Visit | Attending: Obstetrics | Admitting: Obstetrics

## 2022-01-26 DIAGNOSIS — Z01411 Encounter for gynecological examination (general) (routine) with abnormal findings: Secondary | ICD-10-CM | POA: Diagnosis present

## 2022-01-26 DIAGNOSIS — N912 Amenorrhea, unspecified: Secondary | ICD-10-CM | POA: Diagnosis present

## 2022-01-26 NOTE — Progress Notes (Signed)
Tried called pt to give her results; mailbox is not set up yet.

## 2022-02-03 ENCOUNTER — Other Ambulatory Visit (HOSPITAL_COMMUNITY)
Admission: RE | Admit: 2022-02-03 | Discharge: 2022-02-03 | Disposition: A | Discharge status: Home / Self Care | Payer: Medicaid Other | Source: Ambulatory Visit | Attending: Obstetrics & Gynecology | Admitting: Obstetrics & Gynecology

## 2022-02-03 ENCOUNTER — Encounter: Payer: Self-pay | Admitting: Obstetrics & Gynecology

## 2022-02-03 ENCOUNTER — Ambulatory Visit (INDEPENDENT_AMBULATORY_CARE_PROVIDER_SITE_OTHER): Payer: Medicaid Other | Admitting: Obstetrics & Gynecology

## 2022-02-03 VITALS — BP 120/70 | Ht 69.0 in | Wt 211.0 lb

## 2022-02-03 DIAGNOSIS — N889 Noninflammatory disorder of cervix uteri, unspecified: Secondary | ICD-10-CM

## 2022-02-03 DIAGNOSIS — N914 Secondary oligomenorrhea: Secondary | ICD-10-CM | POA: Diagnosis not present

## 2022-02-03 DIAGNOSIS — Z8049 Family history of malignant neoplasm of other genital organs: Secondary | ICD-10-CM | POA: Diagnosis not present

## 2022-02-03 LAB — POCT URINE PREGNANCY: Preg Test, Ur: NEGATIVE

## 2022-02-03 NOTE — Addendum Note (Signed)
Addended by: Burtis Junes on: 02/03/2022 10:52 AM   Modules accepted: Orders

## 2022-02-03 NOTE — Progress Notes (Signed)
   Subjective:    Patient ID: Cathy Ochoa, female    DOB: 04/26/1976, 46 y.o.   MRN: [id number]  HPI 46 yo single P2 (76 and 71 yo kids) is here today on the schedule for an EMBX to further the evaluation of her oligomenorrhea. She reports menarche at age 39 followed by monthly normal periods until about age 57. At that time she went for a full year without a period and only has about 1 per year.  She was seen here by Dr. Okey Dupre last month and had a pelvic ultrasound that showed a 13 mm endometrium and no abnormalities. Her FSH at that time was 6.2. She is abstinent for several years. She was noted to have trich on her pap last month and she recently started the treatment. Her first pap in 13 years was last month and it was read as normal (except for trich). She had a LEEP during her pregnancy 21 years ago.    Review of Systems     Objective:   Physical Exam Well nourished, well hydrated White female, no apparent distress She is ambulating and conversing normally. Genitourinary:             External: Normal external female genitalia.  Normal urethral meatus, normal Bartholin's and Skene's glands.               Vagina: Normal vaginal mucosa, no evidence of prolapse.               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 There is no abnormal discharge (no frothy or otherwise).               I applied Hurricaine spray to the cervix and obtained a cervical biopsy at the 12 o'clock position.SIlver nitrate was used to achieve hemostasis.   UPT negative, consent signed, time out done Cervix prepped with betadine and sprayed with Hurricaine spray. I then grasped with a single tooth tenaculum. Uterus sounded to 9 cm Pipelle used and I obtained a moderate amount of tissue obtained. She tolerated the procedure well.  She was offered 800mg  IBU after the procedure but declined this.         Assessment & Plan:   1) long standing oligomenorrhea with + fh of  uterine cancer in her mom (died at age 73) - await EMBX results  2) abnormal appearance of cervix with h/o LEEP (but with normal pap smear) - await pathology  3) she would like a referral to genetic counselor due to her mom's diagnosis  I have encouraged her to get Mychart.

## 2022-02-07 ENCOUNTER — Encounter: Payer: Self-pay | Admitting: Obstetrics & Gynecology

## 2022-02-07 LAB — SURGICAL PATHOLOGY

## 2022-02-08 ENCOUNTER — Ambulatory Visit: Payer: Medicaid Other | Admitting: Family Medicine

## 2022-02-15 ENCOUNTER — Inpatient Hospital Stay: Admission: RE | Admit: 2022-02-15 | Payer: Medicaid Other | Source: Ambulatory Visit

## 2022-02-24 ENCOUNTER — Inpatient Hospital Stay: Payer: Medicaid Other | Attending: Oncology | Admitting: Licensed Clinical Social Worker

## 2022-02-24 ENCOUNTER — Inpatient Hospital Stay: Payer: Medicaid Other

## 2022-02-24 ENCOUNTER — Encounter: Payer: Self-pay | Admitting: Licensed Clinical Social Worker

## 2022-02-24 DIAGNOSIS — Z803 Family history of malignant neoplasm of breast: Secondary | ICD-10-CM

## 2022-02-24 DIAGNOSIS — Z8049 Family history of malignant neoplasm of other genital organs: Secondary | ICD-10-CM | POA: Diagnosis not present

## 2022-02-24 NOTE — Progress Notes (Signed)
REFERRING PROVIDER: Allie Bossier, MD [address] [place],  [place] [address]  PRIMARY PROVIDER:  Jerrol Banana, MD  PRIMARY REASON FOR VISIT:  1. Family history of uterine cancer   2. Family history of breast cancer      HISTORY OF PRESENT ILLNESS:   Cathy Ochoa, a 46 y.o. female, was seen for a [facility] cancer genetics consultation at the request of Dr. Marice Potter due to a family history of uterine cancer.  Cathy Ochoa presents to clinic today to discuss the possibility of a hereditary predisposition to cancer, genetic testing, and to further clarify her future cancer risks, as well as potential cancer risks for family members.   CANCER HISTORY:  Cathy Ochoa is a 46 y.o. female with no personal history of cancer.    RISK FACTORS:  Menarche was at age 68.  First live birth at age 74.  OCP use for approximately less than a year. Ovaries intact: yes. Has 1 ovary.  Hysterectomy: no.  Menopausal status: premenopausal.  HRT use: 0 years. Colonoscopy: no; not examined. Mammogram within the last year: no. Number of breast biopsies: 0. Up to date with pelvic exams: yes.  Past Medical History:  Diagnosis Date   Depression     Past Surgical History:  Procedure Laterality Date   CESAREAN SECTION  2010    FAMILY HISTORY:  We obtained a detailed, 4-generation family history.  Significant diagnoses are listed below: Family History  Problem Relation Age of Onset   Uterine cancer Mother 70       dx 53s   Breast cancer Paternal Grandmother        dx 37s   Cathy Ochoa has 1 daughter, 38, and 1 son ,38. She has 1 full brother, and 1 paternal half brother and 1 paternal half sister.   Cathy Ochoa mother had uterine cancer in her 52s and passed at 64. No other known cancers on this side of the family.  Cathy Ochoa father died at 48. Paternal grandmother had breast cancer in her 53s-80s. No other known cancers on this side of the family.  Cathy Ochoa is unaware  of previous family history of genetic testing for hereditary cancer risks. There is no reported Ashkenazi Jewish ancestry. There is no known consanguinity.    GENETIC COUNSELING ASSESSMENT: Cathy Ochoa is a 46 y.o. female with a family history of uterine cancer which is somewhat suggestive of a hereditary cancer syndrome and predisposition to cancer. We, therefore, discussed and recommended the following at today's visit.   DISCUSSION: We discussed that approximately 10% of cancer is hereditary. Most cases of hereditary uterine cancer are associated with Lynch syndrome genes, although there are other genes associated with hereditary  cancer as well. Cancers and risks are gene specific. We discussed that testing is beneficial for several reasons including knowing about cancer risks, identifying potential screening and risk-reduction options that may be appropriate, and to understand if other family members could be at risk for cancer and allow them to undergo genetic testing.   We reviewed the characteristics, features and inheritance patterns of hereditary cancer syndromes. We also discussed genetic testing, including the appropriate family members to test, the process of testing, insurance coverage and turn-around-time for results. We discussed the implications of a negative, positive and/or variant of uncertain significant result. We recommended Cathy Ochoa pursue genetic testing for the [facility] Common Hereditary Cancers+RNA gene panel.   Based on Cathy Ochoa's family history of cancer, she meets medical  criteria for genetic testing. Despite that she meets criteria, she may still have an out of pocket cost. We discussed that if her out of pocket cost for testing is over $100, the laboratory will call and confirm whether she wants to proceed with testing.  If the out of pocket cost of testing is less than $100 she will be billed by the genetic testing laboratory.   We discussed that some people do  not want to undergo genetic testing due to fear of genetic discrimination.  A federal law called the Genetic Information Non-Discrimination Act (GINA) of 2008 helps protect individuals against genetic discrimination based on their genetic test results.  It impacts both health insurance and employment.  For health insurance, it protects against increased premiums, being kicked off insurance or being forced to take a test in order to be insured.  For employment it protects against hiring, firing and promoting decisions based on genetic test results.  Health status due to a cancer diagnosis is not protected under GINA.  This law does not protect life insurance, disability insurance, or other types of insurance.   PLAN: After considering the risks, benefits, and limitations, Cathy Ochoa provided informed consent to pursue genetic testing and the blood sample was sent to [facility] for analysis of the Common Hereditary Cancers+RNA panel. Results should be available within approximately 2-3 weeks' time, at which point they will be disclosed by telephone to Ms. Taylor, as will any additional recommendations warranted by these results. Cathy Ochoa will receive a summary of her genetic counseling visit and a copy of her results once available. This information will also be available in Epic.   Cathy Ochoa questions were answered to her satisfaction today. Our contact information was provided should additional questions or concerns arise. Thank you for the referral and allowing [place] to share in the care of your patient.   Lacy Duverney, MS, [facility] Genetic Counselor [email or web address] Phone: [phone]  The patient was seen for a total of 25 minutes in face-to-face genetic counseling. Patient was seen with her daughter, Cathy Ochoa.  Dr. Orlie Dakin was available for discussion regarding this case.   _______________________________________________________________________ For Office Staff:   Number of people involved in session: 2 Was an Intern/ student involved with case: no

## 2022-02-28 ENCOUNTER — Other Ambulatory Visit (HOSPITAL_COMMUNITY)
Admission: RE | Admit: 2022-02-28 | Discharge: 2022-02-28 | Disposition: A | Discharge status: Home / Self Care | Payer: Medicaid Other | Source: Ambulatory Visit | Attending: Advanced Practice Midwife | Admitting: Advanced Practice Midwife

## 2022-02-28 ENCOUNTER — Encounter: Payer: Medicaid Other | Admitting: Advanced Practice Midwife

## 2022-02-28 ENCOUNTER — Encounter: Payer: Self-pay | Admitting: Advanced Practice Midwife

## 2022-02-28 VITALS — BP 130/80 | Ht 69.0 in | Wt 212.0 lb

## 2022-02-28 DIAGNOSIS — Z113 Encounter for screening for infections with a predominantly sexual mode of transmission: Secondary | ICD-10-CM | POA: Diagnosis present

## 2022-02-28 NOTE — Progress Notes (Signed)
This encounter was created in error - please disregard.  The patient was only seen by the CMA for STD testing. No CNM contact.

## 2022-03-01 DIAGNOSIS — Z1379 Encounter for other screening for genetic and chromosomal anomalies: Secondary | ICD-10-CM

## 2022-03-01 HISTORY — DX: Encounter for other screening for genetic and chromosomal anomalies: Z13.79

## 2022-03-01 LAB — CERVICOVAGINAL ANCILLARY ONLY
Bacterial Vaginitis (gardnerella): NEGATIVE
Candida Glabrata: NEGATIVE
Candida Vaginitis: NEGATIVE
Chlamydia: NEGATIVE
Comment: NEGATIVE
Comment: NEGATIVE
Comment: NEGATIVE
Comment: NEGATIVE
Comment: NEGATIVE
Comment: NORMAL
Neisseria Gonorrhea: NEGATIVE
Trichomonas: NEGATIVE

## 2022-03-11 ENCOUNTER — Encounter: Payer: Self-pay | Admitting: Family Medicine

## 2022-03-14 ENCOUNTER — Other Ambulatory Visit: Payer: Self-pay

## 2022-03-14 DIAGNOSIS — F339 Major depressive disorder, recurrent, unspecified: Secondary | ICD-10-CM

## 2022-03-14 MED ORDER — VENLAFAXINE HCL ER 75 MG PO CP24: 75.0000 mg | ORAL_CAPSULE | Freq: Every day | ORAL | 0 refills | Status: DC

## 2022-03-14 NOTE — Telephone Encounter (Signed)
Please review.  KP

## 2022-03-15 ENCOUNTER — Encounter: Payer: Self-pay | Admitting: Licensed Clinical Social Worker

## 2022-03-15 ENCOUNTER — Ambulatory Visit: Payer: Self-pay | Admitting: Licensed Clinical Social Worker

## 2022-03-15 ENCOUNTER — Telehealth: Payer: Self-pay | Admitting: Licensed Clinical Social Worker

## 2022-03-15 DIAGNOSIS — Z1379 Encounter for other screening for genetic and chromosomal anomalies: Secondary | ICD-10-CM

## 2022-03-15 NOTE — Telephone Encounter (Signed)
I contacted Ms. Tober to discuss her genetic testing results. No pathogenic variants were identified in the 47 genes analyzed. Detailed clinic note to follow.   The test report has been scanned into EPIC and is located under the Molecular Pathology section of the Results Review tab.  A portion of the result report is included below for reference.      Lacy Duverney, MS, [facility] Genetic Counselor [email or web address] Phone: [phone]

## 2022-03-15 NOTE — Progress Notes (Signed)
HPI:   Ms. Osbourn was previously seen in the [facility] clinic due to a family history of cancer and concerns regarding a hereditary predisposition to cancer. Please refer to our prior cancer genetics clinic note for more information regarding our discussion, assessment and recommendations, at the time. Ms. Loughney recent genetic test results were disclosed to her, as were recommendations warranted by these results. These results and recommendations are discussed in more detail below.  CANCER HISTORY:  Oncology History   No history exists.    FAMILY HISTORY:  We obtained a detailed, 4-generation family history.  Significant diagnoses are listed below: Family History  Problem Relation Age of Onset   Uterine cancer Mother 34       dx 43s   Breast cancer Paternal Grandmother        dx 79s    Ms. Jurgensen has 1 daughter, 10, and 1 son ,43. She has 1 full brother, and 1 paternal half brother and 1 paternal half sister.    Ms. Dedic mother had uterine cancer in her 73s and passed at 17. No other known cancers on this side of the family.   Ms. Basque father died at 11. Paternal grandmother had breast cancer in her 23s-80s. No other known cancers on this side of the family.   Ms. Cardosa is unaware of previous family history of genetic testing for hereditary cancer risks. There is no reported Ashkenazi Jewish ancestry. There is no known consanguinity.       GENETIC TEST RESULTS:  The [facility] Common Hereditary Cancers+RNA Panel found no pathogenic mutations.   The Common Hereditary Cancers Panel + RNA offered by [facility] includes sequencing and/or deletion duplication testing of the following 47 genes: APC, ATM, AXIN2, BARD1, BMPR1A, BRCA1, BRCA2, BRIP1, CDH1, CDKN2A (p14ARF), CDKN2A (p16INK4a), CKD4, CHEK2, CTNNA1, DICER1, EPCAM (Deletion/duplication testing only), GREM1 (promoter region deletion/duplication testing only), KIT, MEN1, MLH1, MSH2, MSH3, MSH6,  MUTYH, NBN, NF1, NHTL1, PALB2, PDGFRA, PMS2, POLD1, POLE, PTEN, RAD50, RAD51C, RAD51D, SDHB, SDHC, SDHD, SMAD4, SMARCA4. STK11, TP53, TSC1, TSC2, and VHL.  The following genes were evaluated for sequence changes only: SDHA and HOXB13 c.251G>A variant only.   The test report has been scanned into EPIC and is located under the Molecular Pathology section of the Results Review tab.  A portion of the result report is included below for reference. Genetic testing reported out on 03/13/2022.      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  At this time, it is unknown if this variant is associated with an increased risk for cancer or if it is benign, but most uncertain variants are reclassified to benign. It should not be used to make medical management decisions. With time, we suspect the laboratory will determine the significance of this variant, if any. If the laboratory reclassifies this variant, we will attempt to contact Ms. Bonafede to discuss it further.   Even though a pathogenic variant was not identified, possible explanations for the cancer in the family may include: There may be no hereditary risk for cancer in the family. The cancers in her family may be sporadic/familial or due to other genetic and environmental factors. There may be a gene mutation in one of these genes that current testing methods cannot detect but that chance is small. There could be another gene that has not yet been discovered, or that we have not yet tested, that is responsible for the cancer diagnoses in  the family.  It is also possible there is a hereditary cause for the cancer in the family that Ms. Cardoza did not inherit. The variant of uncertain significance detected in the POLE gene may be reclassified as a pathogenic variant in the future. At this time, we do not know if this variant increases the risk for cancer.  Therefore, it is important to remain  in touch with cancer genetics in the future so that we can continue to offer Ms. Scaduto the most up to date genetic testing.   ADDITIONAL GENETIC TESTING:  We discussed with Ms. Fee that her genetic testing was fairly extensive.  If there are additional relevant genes identified to increase cancer risk that can be analyzed in the future, we would be happy to discuss and coordinate this testing at that time.    CANCER SCREENING RECOMMENDATIONS:  Ms. Stitzer test result is considered negative (normal).  This means that we have not identified a hereditary cause for her family history of cancer at this time.   An individual's cancer risk and medical management are not determined by genetic test results alone. Overall cancer risk assessment incorporates additional factors, including personal medical history, family history, and any available genetic information that may result in a personalized plan for cancer prevention and surveillance. Therefore, it is recommended she continue to follow the cancer management and screening guidelines provided by her  primary healthcare provider.  Based on Ms. Ebrahim's family history of cancer as well as her genetic test results, risk model Harriett Rush was used to estimate her risk of developing breast cancer. Her lifetime risk is approximately 15.10%. The patient's lifetime breast cancer risk is a preliminary estimate based on available information using one of several models endorsed by the [employer]  (NCCN). The NCCN recommends consideration of breast MRI screening as an adjunct to mammography for patients at high risk (defined as 20% or greater lifetime risk).  This risk estimate can change over time and may be repeated to reflect new information in her personal or family history in the future.    RECOMMENDATIONS FOR FAMILY MEMBERS:   Since she did not inherit a identifiable mutation in a cancer predisposition gene included on  this panel, her children could not have inherited a known mutation from her in one of these genes. Individuals in this family might be at some increased risk of developing cancer, over the general population risk, due to the family history of cancer.  Individuals in the family should notify their providers of the family history of cancer. We recommend women in this family have a yearly mammogram beginning at age 51, or 68 years younger than the earliest onset of cancer, an annual clinical breast exam, and perform monthly breast self-exams.  Family members should have colonoscopies by at age 35, or earlier, as recommended by their providers. Other members of the family may still carry a pathogenic variant in one of these genes that Ms. Geno did not inherit. Based on the family history, we recommend those related to her mother who had uterine cancer in her 13s have genetic counseling and testing. Ms. Blecher will let us know if we can be of any assistance in coordinating genetic counseling and/or testing for this family member.   We do not recommend familial testing for the variant of uncertain significance (VUS).  FOLLOW-UP:  Lastly, we discussed with Ms. Glosser that cancer genetics is a rapidly advancing field and it is possible that new genetic tests  will be appropriate for her and/or her family members in the future. We encouraged her to remain in contact with cancer genetics on an annual basis so we can update her personal and family histories and let her know of advances in cancer genetics that may benefit this family.   Our contact number was provided. Ms. Scheper questions were answered to her satisfaction, and she knows she is welcome to call us at anytime with additional questions or concerns.    Faith Rogue, MS, [facility] Genetic Counselor [email or web address] Phone: [phone]

## 2022-03-24 ENCOUNTER — Ambulatory Visit (INDEPENDENT_AMBULATORY_CARE_PROVIDER_SITE_OTHER): Payer: Medicaid Other | Admitting: Family Medicine

## 2022-03-24 ENCOUNTER — Encounter: Payer: Self-pay | Admitting: Family Medicine

## 2022-03-24 VITALS — BP 120/78 | HR 95 | Ht 69.0 in | Wt 207.0 lb

## 2022-03-24 DIAGNOSIS — F339 Major depressive disorder, recurrent, unspecified: Secondary | ICD-10-CM | POA: Diagnosis not present

## 2022-03-24 MED ORDER — AUVELITY 45-105 MG PO TBCR: 1.0000 | EXTENDED_RELEASE_TABLET | Freq: Two times a day (BID) | ORAL | 2 refills | Status: AC

## 2022-03-24 NOTE — Assessment & Plan Note (Signed)
Initial improvement in symptoms after initiating Effexor and titrating to 75 mg as noted by interim PHQ and GAD scores.  Since that time she has had increased life stressors and associated worsening symptoms, decreased motivation, crying spells, feeling down.  Discussed options including further titration versus augmentation.  Patient has elected to proceed with initiation of Auvelity addition to her current Effexor 75 mg dose.  Coordinate follow-up at her return annual physical at which point we can determine the need to further titrate Effexor.

## 2022-03-24 NOTE — Patient Instructions (Addendum)
-   Continue current Effexor dose - Start Auvelity, dose 1 tablet daily in the morning x3 days - After 3 days dose 1 tablet of Auvelity in the morning and 1 tablet of Auvelity in the evening - Contact number below to schedule visit with psychiatry - Return for follow-up in 1 month, contact for any questions between now and then  [phone]

## 2022-03-24 NOTE — Progress Notes (Signed)
     Primary Care / Sports Medicine Office Visit  Patient Information:  Patient ID: Cathy Ochoa, female DOB: 1975/12/25 Age: 46 y.o. MRN: [id number]   Cathy Ochoa is a pleasant 46 y.o. female presenting with the following:  Chief Complaint  Patient presents with   Anxiety/depression    Doesn't feel like the medication is working. Pt is in counseling, but open to PSY referral follow up    Vitals:   03/24/22 0937  BP: 120/78  Pulse: 95  SpO2: 98%   Vitals:   03/24/22 0937  Weight: 207 lb (93.9 kg)  Height: 5\' 9"  (1.753 m)   Body mass index is 30.57 kg/m.  No results found.   Independent interpretation of notes and tests performed by another provider:   None  Procedures performed:   None  Pertinent History, Exam, Impression, and Recommendations:   Problem List Items Addressed This Visit       Other   Depression - Primary    Initial improvement in symptoms after initiating Effexor and titrating to 75 mg as noted by interim PHQ and GAD scores.  Since that time she has had increased life stressors and associated worsening symptoms, decreased motivation, crying spells, feeling down.  Discussed options including further titration versus augmentation.  Patient has elected to proceed with initiation of Auvelity addition to her current Effexor 75 mg dose.  Coordinate follow-up at her return annual physical at which point we can determine the need to further titrate Effexor.      Relevant Medications   Dextromethorphan-buPROPion ER (AUVELITY) 45-105 MG TBCR     Orders & Medications Meds ordered this encounter  Medications   Dextromethorphan-buPROPion ER (AUVELITY) 45-105 MG TBCR    Sig: Take 1 tablet by mouth 2 (two) times daily.    Dispense:  180 tablet    Refill:  2   No orders of the defined types were placed in this encounter.    Return in about 4 weeks (around 04/21/2022).     04/23/2022, MD   Primary Care Sports Medicine [facility]

## 2022-03-28 ENCOUNTER — Encounter: Payer: Self-pay | Admitting: Obstetrics and Gynecology

## 2022-03-28 NOTE — Telephone Encounter (Signed)
Thx. She had it done elsewhere but I can't see where it was.

## 2022-03-31 ENCOUNTER — Encounter: Payer: Self-pay | Admitting: Obstetrics and Gynecology

## 2022-04-28 ENCOUNTER — Encounter: Payer: Self-pay | Admitting: Family Medicine

## 2022-04-28 ENCOUNTER — Ambulatory Visit (INDEPENDENT_AMBULATORY_CARE_PROVIDER_SITE_OTHER): Payer: Medicaid Other | Admitting: Family Medicine

## 2022-04-28 DIAGNOSIS — F339 Major depressive disorder, recurrent, unspecified: Secondary | ICD-10-CM

## 2022-04-28 MED ORDER — VENLAFAXINE HCL ER 150 MG PO CP24: 150.0000 mg | ORAL_CAPSULE | Freq: Every day | ORAL | 2 refills | Status: AC

## 2022-04-28 NOTE — Assessment & Plan Note (Signed)
Interim improvement in PHQ, essentially stable GAD, in addition to continuing on Auvelity, we will further titrate Effexor to 150 mg.  We also discussed nonpharmacologic treatment strategies, she is attending twice weekly family counseling with her son, I have encouraged regular physical activity and mindfulness trial.  She will return for follow-up to assess need for further titration, adjunct treatments.

## 2022-04-28 NOTE — Progress Notes (Signed)
     Primary Care / Sports Medicine Office Visit  Patient Information:  Patient ID: [name], female DOB: [birth year] Age: 46 y.o. MRN: [id number]   Cathy Ochoa is a pleasant 45 y.o. female presenting with the following:  Chief Complaint  Patient presents with   Anxiety    Pt states she is tolerating medication well. States she doesn't really notice a difference in mood    Vitals:   04/28/22 0930  BP: 130/82  Pulse: 78  SpO2: 99%   Vitals:   04/28/22 0930  Weight: 213 lb (96.6 kg)  Height: 5\' 9"  (1.753 m)   Body mass index is 31.45 kg/m.  No results found.   Independent interpretation of notes and tests performed by another provider:   None  Procedures performed:   None  Pertinent History, Exam, Impression, and Recommendations:   Problem List Items Addressed This Visit       Other   Depression    Interim improvement in PHQ, essentially stable GAD, in addition to continuing on Auvelity, we will further titrate Effexor to 150 mg.  We also discussed nonpharmacologic treatment strategies, she is attending twice weekly family counseling with her son, I have encouraged regular physical activity and mindfulness trial.  She will return for follow-up to assess need for further titration, adjunct treatments.      Relevant Medications   venlafaxine XR (EFFEXOR XR) 150 MG 24 hr capsule     Orders & Medications Meds ordered this encounter  Medications   venlafaxine XR (EFFEXOR XR) 150 MG 24 hr capsule    Sig: Take 1 capsule (150 mg total) by mouth daily with breakfast.    Dispense:  30 capsule    Refill:  2   No orders of the defined types were placed in this encounter.    Return in about 6 weeks (around 06/09/2022).     Montel Culver, MD   Primary Care Sports Medicine [place]

## 2022-06-09 ENCOUNTER — Ambulatory Visit: Payer: Medicaid Other | Admitting: Family Medicine

## 2022-06-29 ENCOUNTER — Encounter: Payer: Self-pay | Admitting: Obstetrics
